# Patient Record
Sex: Male | Born: 1950 | Race: White | Hispanic: No | Smoking: Current every day smoker
Health system: Southern US, Community
[De-identification: ages and names within clinical notes are randomized; demographics above are authoritative.]

## PROBLEM LIST (undated history)

## (undated) DIAGNOSIS — Z87891 Personal history of nicotine dependence: Secondary | ICD-10-CM

## (undated) HISTORY — DX: Personal history of nicotine dependence: Z87.891

---

## 2006-01-04 ENCOUNTER — Ambulatory Visit: Payer: Self-pay | Admitting: Internal Medicine

## 2006-06-03 ENCOUNTER — Ambulatory Visit: Payer: Self-pay | Admitting: Surgery

## 2007-02-10 ENCOUNTER — Ambulatory Visit: Payer: Self-pay | Admitting: Internal Medicine

## 2007-02-14 ENCOUNTER — Encounter: Payer: Self-pay | Admitting: Internal Medicine

## 2007-02-26 ENCOUNTER — Encounter: Payer: Self-pay | Admitting: Internal Medicine

## 2009-02-28 ENCOUNTER — Ambulatory Visit: Payer: Self-pay | Admitting: Urology

## 2009-02-28 ENCOUNTER — Emergency Department: Payer: Self-pay | Admitting: Emergency Medicine

## 2010-03-17 ENCOUNTER — Encounter: Payer: Self-pay | Admitting: Cardiology

## 2010-04-15 ENCOUNTER — Ambulatory Visit: Payer: Self-pay | Admitting: Cardiology

## 2010-04-15 DIAGNOSIS — F17200 Nicotine dependence, unspecified, uncomplicated: Secondary | ICD-10-CM | POA: Insufficient documentation

## 2010-04-15 DIAGNOSIS — E785 Hyperlipidemia, unspecified: Secondary | ICD-10-CM

## 2010-04-15 DIAGNOSIS — R079 Chest pain, unspecified: Secondary | ICD-10-CM | POA: Insufficient documentation

## 2010-04-22 ENCOUNTER — Ambulatory Visit: Payer: Self-pay | Admitting: Cardiology

## 2010-04-22 ENCOUNTER — Ambulatory Visit (HOSPITAL_COMMUNITY): Admission: RE | Admit: 2010-04-22 | Discharge: 2010-04-22 | Payer: Self-pay | Admitting: Cardiology

## 2010-04-23 ENCOUNTER — Telehealth: Payer: Self-pay | Admitting: Cardiology

## 2010-05-05 ENCOUNTER — Telehealth: Payer: Self-pay | Admitting: Cardiology

## 2010-05-07 ENCOUNTER — Telehealth (INDEPENDENT_AMBULATORY_CARE_PROVIDER_SITE_OTHER): Payer: Self-pay | Admitting: *Deleted

## 2010-05-07 ENCOUNTER — Encounter: Payer: Self-pay | Admitting: Cardiology

## 2010-05-11 ENCOUNTER — Ambulatory Visit: Payer: Self-pay | Admitting: Cardiology

## 2010-05-11 ENCOUNTER — Ambulatory Visit: Payer: Self-pay

## 2010-05-11 ENCOUNTER — Encounter (HOSPITAL_COMMUNITY): Admission: RE | Admit: 2010-05-11 | Discharge: 2010-06-18 | Payer: Self-pay | Admitting: Cardiology

## 2010-05-21 ENCOUNTER — Telehealth: Payer: Self-pay | Admitting: Cardiology

## 2010-08-07 ENCOUNTER — Ambulatory Visit: Payer: Self-pay | Admitting: Unknown Physician Specialty

## 2010-10-27 NOTE — Assessment & Plan Note (Signed)
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 Hochrein Resting Radionuclide:  Technetium 77m Tetrofosmin     Resting Radionuclide Dose:  11.0 mCi  Stress Radionuclide:  Technetium 71m Tetrofosmin     Stress Radionuclide Dose:  33.0 mCi   Stress Protocol Exercise Time (min):  8:31 min     Max HR:  144 bpm     Predicted Max HR:  160 bpm  Max Systolic BP: 174 mm Hg     Percent Max HR:  90 %     METS: 10.1 Rate Pressure Product:  16109    Stress Test Technologist:  Irean Hong RN     Nuclear Technologist:  Domenic Polite CNMT  Rest Procedure  Myocardial perfusion imaging was performed at rest 45 minutes following the intravenous administration of Myoview Technetium 23m Tetrofosmin.  Stress Procedure  The patient exercised for 8 minutes and 31 seconds, RPE=15.  The patient stopped due to chest tightness 5/10 associated with DOE.  There were no significant ST-T wave changes, frequent PAC's, occ. PJC's, and 3 beat PSVT.  Myoview was injected at peak exercise and myocardial perfusion imaging was performed after a brief delay.  QPS Raw Data Images:  Patient motion noted; appropriate software correction  applied. Stress Images:  Mild decrease in activity in the inferior wall. Rest Images:  Same as stress Subtraction (SDS):  No reversibility Transient Ischemic Dilatation:  .99  (Normal <1.22)  Lung/Heart Ratio:  .31  (Normal <0.45)  Quantitative Gated Spect Images QGS EDV:  140 ml QGS ESV:  71 ml QGS EF:  49 % QGS cine images:  Mild hypokinesis  Findings Abnormal      Overall Impression  Exercise Capacity: Fair exercise capacity. BP Response: Normal blood pressure response. Clinical Symptoms: Moderate chest tightness ECG Impression: No significant ST segment change suggestive of ischemia. Overall Impression Comments: There is mild decrease in activity in the inferior wall. This may be mild scar with very mild ischemia.  Appended Document: Cardiology Nuclear Testing Discussed with the patient.  No evidence of ischemia in the LAD distribution.  He needs aggressive risk reduction to include no smoking.  He knows to call me with any symptoms in the future.

## 2010-10-27 NOTE — Assessment & Plan Note (Signed)
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 Alonna Buckler  CC:  chest pain.  History of Present Illness: The patient presents for evaluation of chest pain. He has no prior cardiac history but has significant cardiovascular risk factors. He reports that for 2-1/2 months he's been getting chest discomfort. This has been happening sporadically. It is at rest. It is a broad discomfort over his anterior chest without radiation to his neck or to his arms. It is not associated with nausea, vomiting or diaphoresis. It goes away spontaneously after minutes. It is somewhat mild but still very noticeable. He does not exercise routinely. With his activities of daily living he doesn't bring this on. He has noticed however with his activities but he is more fatigued and has a definite decreased exercise tolerance. He has not noticed any palpitations, presyncope or syncope. He is not describing any PND or orthopnea. He did have an exercise echocardiogram. His heart rate rose quickly with exercise reaching target in stage II. I do not have access to the images. However, after reviewing results he was referred here by his primary physician 2 rule out obstructive coronary disease.  Current Medications (verified): 1)  Cialis 5 Mg Tabs (Tadalafil) .... As Needed 2)  Aspirin 81 Mg  Tabs (Aspirin) .Marland Kitchen.. 1 By Mouth Daily 3)  Mirapex 1.5 Mg Tabs (Pramipexole Dihydrochloride) .Marland Kitchen.. 1 By Mouth Dialy 4)  Uroxatral 10 Mg Xr24h-Tab (Alfuzosin Hcl) .Marland Kitchen.. 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 Aseptic meningitis in October 1995 BPH Colonic polyps by 12/04. Right pulmonary nodule bt CT 2009. 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 [facility] 2007  Family History: Both parents are deceased.His father  headcornory disease, status post MI and cornonary artery bypass grafting early onset:  He  also had prostste cancer,status post irradiation treatment. Mother had a history  of longstanding hypertension and chronic atrial fibrillation and congestive heart failure: also diabetes.  Brother with myocardial infarction late 1s  Social History: He does smoke a pack a day and has done so for many years. Drinks alcohol. Had a Hepatitis B Vaccine series.PPD was negative in 1999. He has been divorced twice.He has four children.    Review of Systems       As stated in the HPI and negative for all other systems.   Vital Signs:  Patient profile:   60 year old male Height:      75 inches Weight:      172 pounds BMI:     21.58 Pulse rate:   64 / minute Resp:     16 per minute BP sitting:   110 / 74  (left arm)  Vitals Entered By: Marrion Coy, CNA (April 15, 2010 4:06 PM)  Physical Exam  General:  Well developed, well nourished, in no acute distress. Head:  normocephalic and atraumatic Eyes:  PERRLA/EOM intact; conjunctiva and lids normal. Mouth:  Teeth, gums and palate normal. Oral mucosa normal. Neck:  Neck supple, no JVD. No masses, thyromegaly  or abnormal cervical nodes. Chest Wall:  no deformities or breast masses noted Lungs:  Clear bilaterally to auscultation and percussion. Abdomen:  Bowel sounds positive; abdomen soft and non-tender without masses, organomegaly, or hernias noted. No hepatosplenomegaly. Msk:  Back normal, normal gait. Muscle strength and tone normal. Extremities:  No clubbing or cyanosis. Neurologic:  Alert and oriented x 3. Skin:  Intact  without lesions or rashes. Cervical Nodes:  no significant adenopathy Inguinal Nodes:  no significant adenopathy Psych:  Normal affect.   Detailed Cardiovascular Exam  Neck    Carotids: Carotids full and equal bilaterally without bruits.      Neck Veins: Normal, no JVD.    Heart    Inspection: no deformities or lifts noted.      Palpation: normal PMI with no thrills palpable.      Auscultation: regular rate and rhythm, S1, S2 without murmurs, rubs, gallops, or clicks.    Vascular    Abdominal Aorta: no palpable masses, pulsations, or audible bruits.      Femoral Pulses: normal femoral pulses bilaterally.      Pedal Pulses: normal pedal pulses bilaterally.      Radial Pulses: normal radial pulses bilaterally.      Peripheral Circulation: no clubbing, cyanosis, or edema noted with normal capillary refill.     EKG  Procedure date:  04/15/2010  Findings:      Sinus rhythm, rate 64, axis within normal limits, intervals within normal limits, no acute ST-T wave changes.  Impression & Recommendations:  Problem # 1:  CHEST PAIN (ICD-786.50) The patient has chest pain that is consistent with unstable angina. A stress test was equivocal. He has very remarkable risk factors and thus has a strong pretest probability of obstructive coronary disease. Coronary CT angiography would be an excellent test in this situation offering a noninvasive approach to rule out obstructive coronary disease. Orders: EKG w/ Interpretation (93000) Cardiac CTA (Cardiac CTA)  Problem # 2:  TOBACCO ABUSE (ICD-305.1) We had a long discussion about the need to stop smoking. He is not at a point in his life we can try to Chantix but he will consider this in the future.  Problem # 3:  DYSLIPIDEMIA (ICD-272.4) The patient's lipids were reviewed. His LDL was 100. HDL was 35. I would suggest an ideal lipid level for this gentleman would be an LDL less than 70 and HDL greater then 50. He needs exercise. I have  suggested Lipitor 10 mg daily.  Patient Instructions: 1)  Your physician recommends that you schedule a follow-up appointment as needed 2)  Your physician recommends that you continue on your current medications as directed. Please refer to the Current Medication list given to you today. 3)  You have been referred for a Cardiac CT Angiogram.

## 2010-10-27 NOTE — Progress Notes (Signed)
Summary: pt wants to talk to [facility] 05/04/2010  Phone Note Call from Patient Call back at [facility] Phone [phone]   Caller: Patient Summary of Call: test results Initial call taken by: Judie Grieve,  April 23, 2010 3:43 PM  Follow-up for Phone Call        Tired to call Follow-up by: Rollene Rotunda, MD, [facility],  April 24, 2010 2:23 PM  Additional Follow-up for Phone Call Additional follow up Details #1::        spoke with pt about results.  Dr Shirlee Latch  spoke with pt about the results and per pt told him he had moderate disease.  Pt would like to speak with Dr Antoine Poche but is out of town.  He would like to be call 05/04/2010.  Will forward message to Dr Antoine Poche Additional Follow-up by: Charolotte Capuchin, RN,  April 24, 2010 5:54 PM     Appended Document: pt wants to talk to [facility] 05/04/2010 Discussed with the patient.  I will arrange a stress perfusion study when he returns from vacation.

## 2010-10-27 NOTE — Progress Notes (Signed)
Summary: results of test***unable to leave message***  Phone Note Call from Patient   Reason for Call: Talk to Nurse, Lab or Test Results Summary of Call: pt wants results of test last week -pls call [phone] Initial call taken by: Glynda Jaeger,  May 21, 2010 1:25 PM  Follow-up for Phone Call        attempted to contact pt at [phone]--unable to leave a message Katina Dung, RN, BSN  May 21, 2010 3:04 PM   Information sent to [facility]. Asked her to review the results with Dr. Antoine Poche and for him to contact the patient regarding the results of his stress myoview. Whitney Maeola Sarah RN  May 21, 2010 5:04 PM   Additional Follow-up for Phone Call Additional follow up Details #1::        pt calling back-says he always answers the phone and if doesn't call back [phone] Glynda Jaeger  May 22, 2010 11:56 AM    Additional Follow-up for Phone Call Additional follow up Details #2::    Results called to the patient. Follow-up by: Rollene Rotunda, MD, [facility],  May 22, 2010 5:42 PM

## 2010-10-27 NOTE — Progress Notes (Signed)
Summary: Nuc Pre-Procedure  Phone Note Outgoing Call Call back at [facility] Phone [phone]   Call placed by: Antionette Char RN,  May 07, 2010 11:07 AM Call placed to: Patient Reason for Call: Confirm/change Appt Summary of Call: Reviewed information on Myoview Information Sheet (see scanned document for further details).  Spoke with patient.

## 2010-10-27 NOTE — Progress Notes (Signed)
Summary: schedule exercise/stress myoview  ---- Converted from flag ---- ---- 05/04/2010 12:55 PM, Rollene Rotunda, MD, [facility] wrote: This patient needs to have a stress prefusion study to evaluate the hemodynamic significance of the LAD stenosis seen on CT.  He can walk it. ------------------------------  Phone Note Outgoing Call   Summary of Call: Dr Antoine Poche has already spoken with pt and would like to get an exercise stress myoview scheduled. Order placed. [facility] will contact pt with appt. No beta blockers listed on medication list.  Julieta Gutting, RN, BSN  May 05, 2010 1:20 PM

## 2011-10-19 ENCOUNTER — Ambulatory Visit: Payer: Self-pay | Admitting: Unknown Physician Specialty

## 2011-10-20 LAB — PATHOLOGY REPORT

## 2012-07-30 ENCOUNTER — Other Ambulatory Visit: Payer: Self-pay | Admitting: Family Medicine

## 2012-07-30 LAB — PROTIME-INR
INR: 0.9
Prothrombin Time: 12.2 secs (ref 11.5–14.7)

## 2015-08-29 ENCOUNTER — Other Ambulatory Visit: Payer: Self-pay | Admitting: Family Medicine

## 2015-08-29 ENCOUNTER — Encounter: Payer: Self-pay | Admitting: Family Medicine

## 2015-08-29 ENCOUNTER — Inpatient Hospital Stay: Payer: Medicare Other | Attending: Family Medicine | Admitting: Family Medicine

## 2015-08-29 ENCOUNTER — Inpatient Hospital Stay: Admission: RE | Admit: 2015-08-29 | Payer: Self-pay | Source: Ambulatory Visit

## 2015-08-29 DIAGNOSIS — Z87891 Personal history of nicotine dependence: Secondary | ICD-10-CM

## 2015-08-29 HISTORY — DX: Personal history of nicotine dependence: Z87.891

## 2015-09-05 ENCOUNTER — Encounter: Payer: Self-pay | Admitting: Family Medicine

## 2015-09-05 ENCOUNTER — Inpatient Hospital Stay (HOSPITAL_BASED_OUTPATIENT_CLINIC_OR_DEPARTMENT_OTHER): Payer: Medicare Other | Admitting: Family Medicine

## 2015-09-05 ENCOUNTER — Ambulatory Visit
Admission: RE | Admit: 2015-09-05 | Discharge: 2015-09-05 | Disposition: A | Discharge status: Home / Self Care | Payer: Medicare Other | Source: Ambulatory Visit | Attending: Family Medicine | Admitting: Family Medicine

## 2015-09-05 DIAGNOSIS — J439 Emphysema, unspecified: Secondary | ICD-10-CM | POA: Insufficient documentation

## 2015-09-05 DIAGNOSIS — Z122 Encounter for screening for malignant neoplasm of respiratory organs: Secondary | ICD-10-CM

## 2015-09-05 DIAGNOSIS — Z87891 Personal history of nicotine dependence: Secondary | ICD-10-CM | POA: Diagnosis not present

## 2015-09-05 DIAGNOSIS — I700 Atherosclerosis of aorta: Secondary | ICD-10-CM | POA: Insufficient documentation

## 2015-09-05 NOTE — Progress Notes (Signed)
In accordance with CMS guidelines, patient has meet eligibility criteria including age, absence of signs or symptoms of lung cancer, the specific calculation of cigarette smoking pack-years was 40 years and is a current smoker.   A shared decision-making session was conducted prior to the performance of CT scan. This includes one or more decision aids, includes benefits and harms of screening, follow-up diagnostic testing, over-diagnosis, false positive rate, and total radiation exposure.  Counseling on the importance of adherence to annual lung cancer LDCT screening, impact of co-morbidities, and ability or willingness to undergo diagnosis and treatment is imperative for compliance of the program.  Counseling on the importance of continued smoking cessation for former smokers; the importance of smoking cessation for current smokers and information about tobacco cessation interventions have been given to patient including the [place] at [facility], 1800 quit [place], as well as Cancer Center specific smoking cessation programs.  Written order for lung cancer screening with LDCT has been given to the patient and any and all questions have been answered to the best of my abilities.   Yearly follow up will be scheduled by Shawn Perkins, Thoracic Navigator.   

## 2015-09-10 ENCOUNTER — Telehealth: Payer: Self-pay | Admitting: *Deleted

## 2015-09-10 NOTE — Telephone Encounter (Signed)
Attempted to notify patient of results of LDCT lung cancer screening. Unable to reach patient by phone. Results mailed to patient.

## 2015-10-29 DEATH — deceased

## 2017-10-21 IMAGING — CT CT CHEST LUNG CANCER SCREENING LOW DOSE W/O CM
2 of 4 series · 15 of 40 positions shown, 18 images · non-contrast
Comparison: None.

CLINICAL DATA: Current smoker. Asymptomatic. Forty pack-year
history.

EXAM:
CT CHEST WITHOUT CONTRAST
TECHNIQUE: Multidetector CT imaging of the chest was performed following the
standard protocol without IV contrast.

[Series 2: routine chest wo · axial · 0.73mm/px · z∈[-608,-308]mm · 12 of 72 slices shown, 15 images]
[im 6/72  mediastinal]
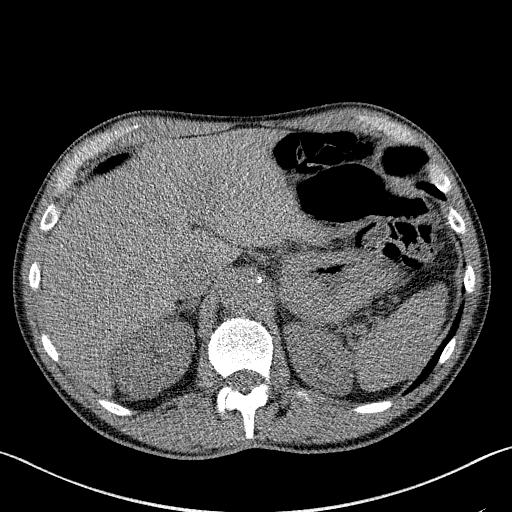
[im 6/72  lung]
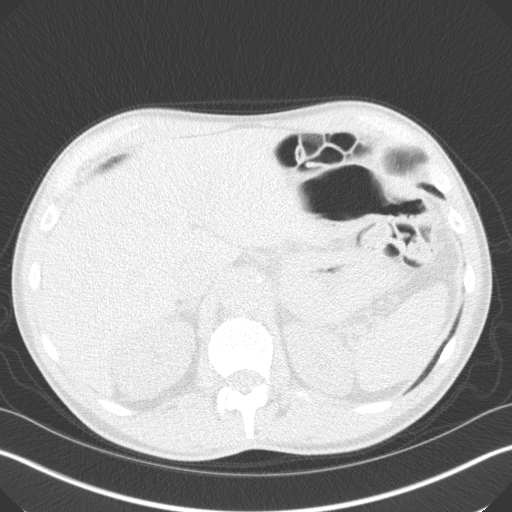
[im 11/72  lung]
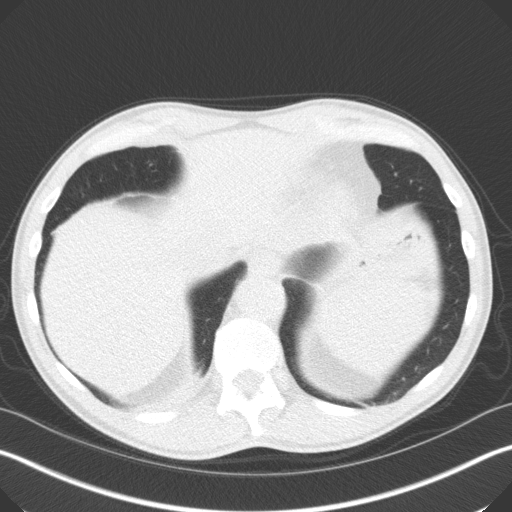
[im 17/72  lung]
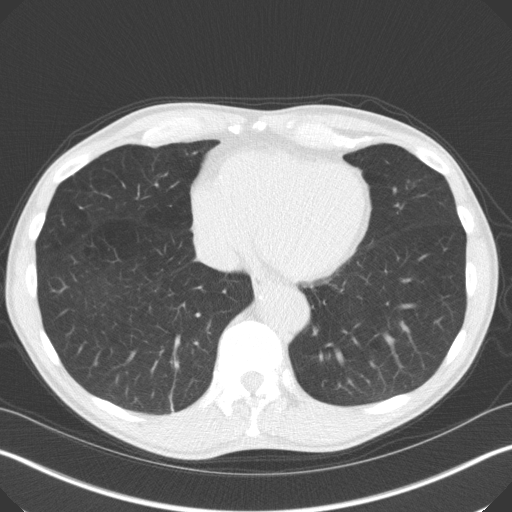
[im 22/72  lung]
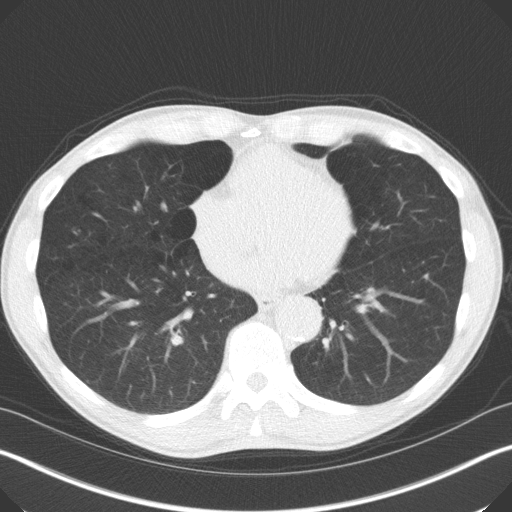
[im 28/72  mediastinal]
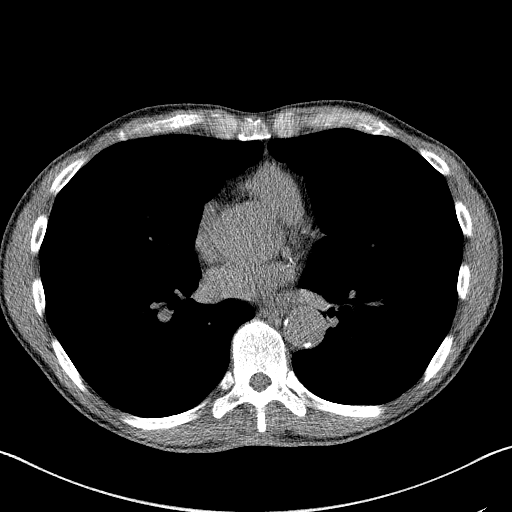
[im 28/72  lung]
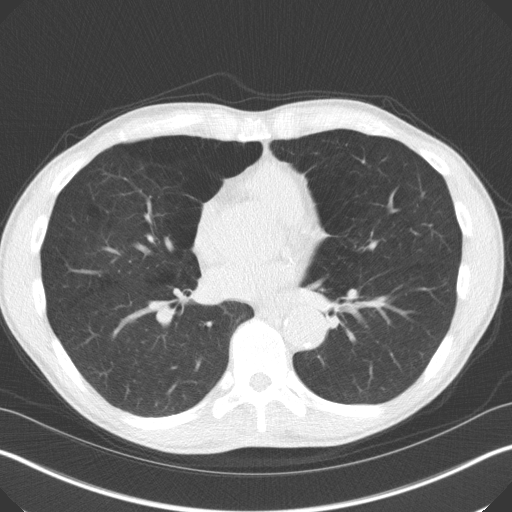
[im 33/72  lung]
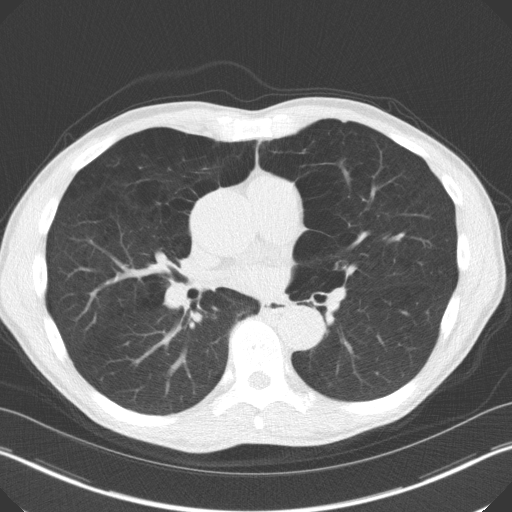
[im 39/72  lung]
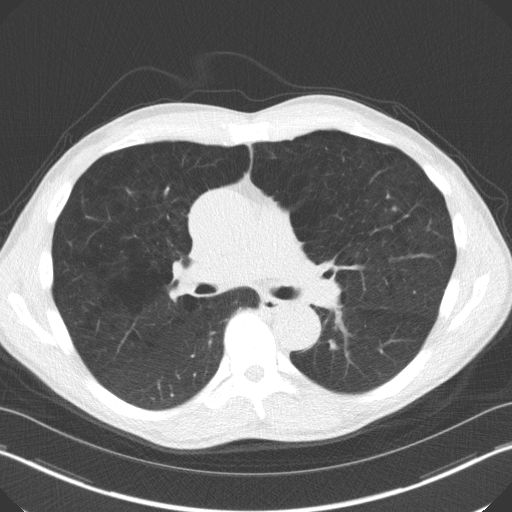
[im 44/72  lung]
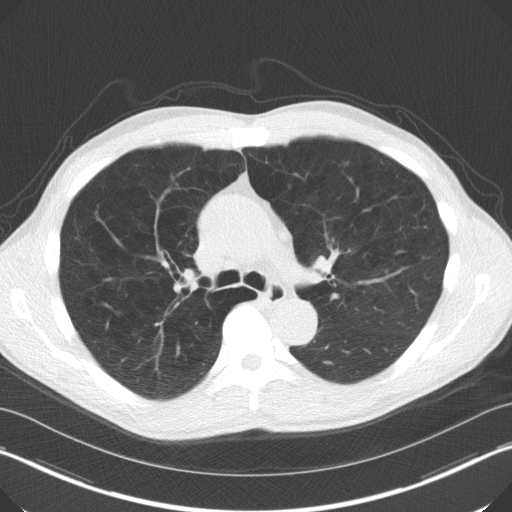
[im 50/72  mediastinal]
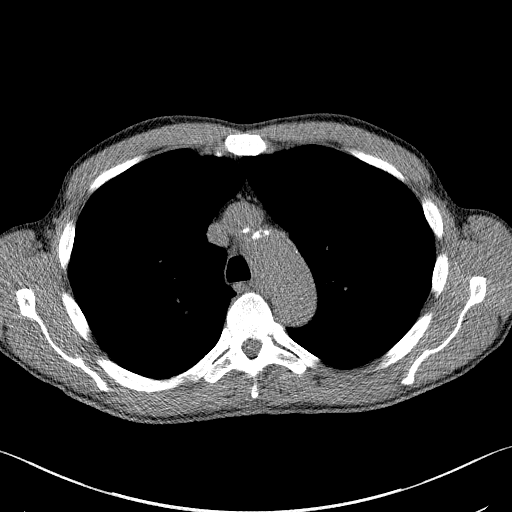
[im 50/72  lung]
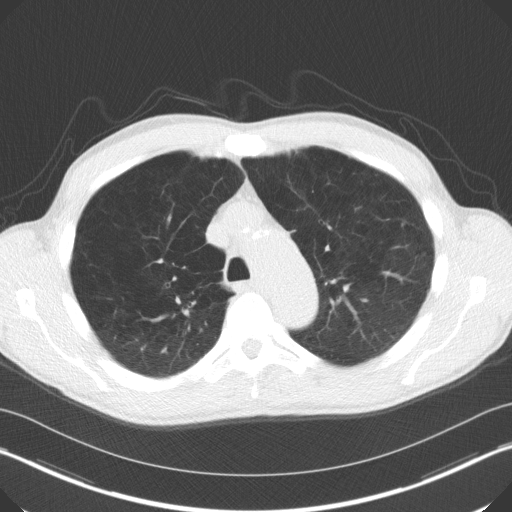
[im 55/72  lung]
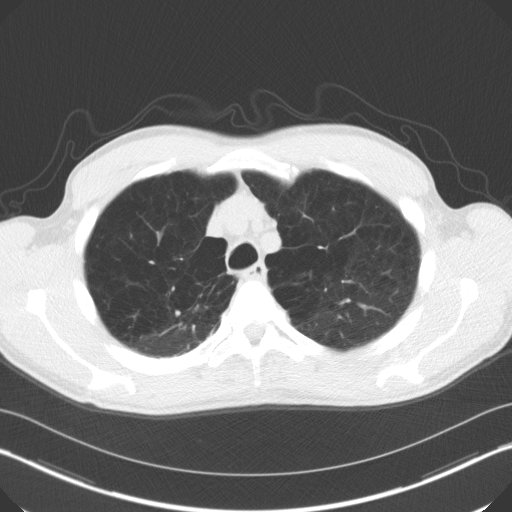
[im 61/72  lung]
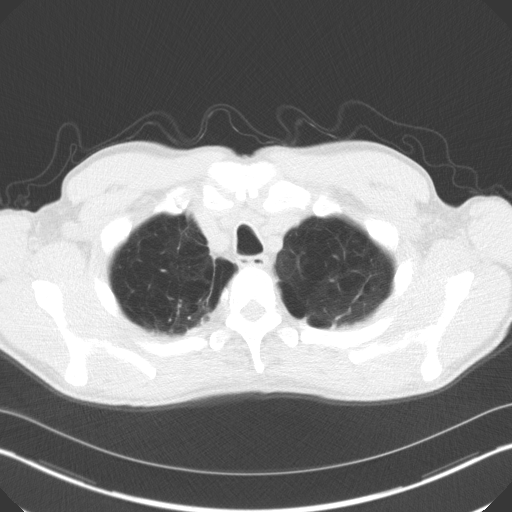
[im 66/72  lung]
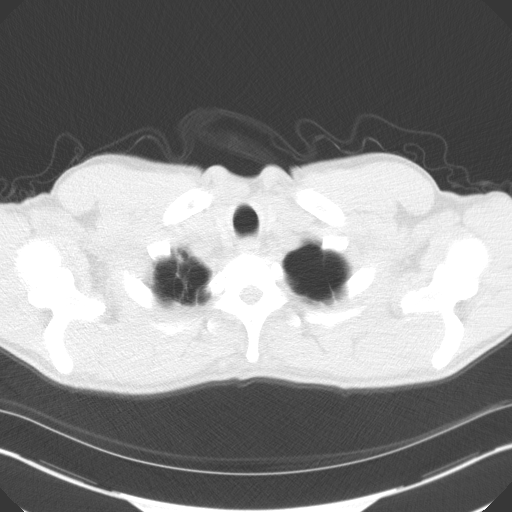

[Series 5: routine chest wo cor · coronal · 0.72mm/px · 3 of 259 slices shown]
[im 52/259  lung]
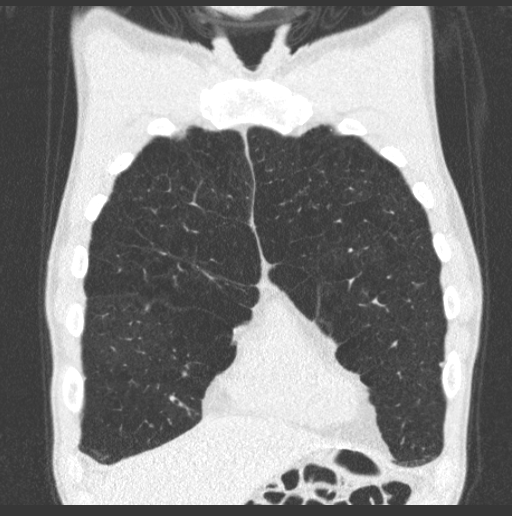
[im 104/259  lung]
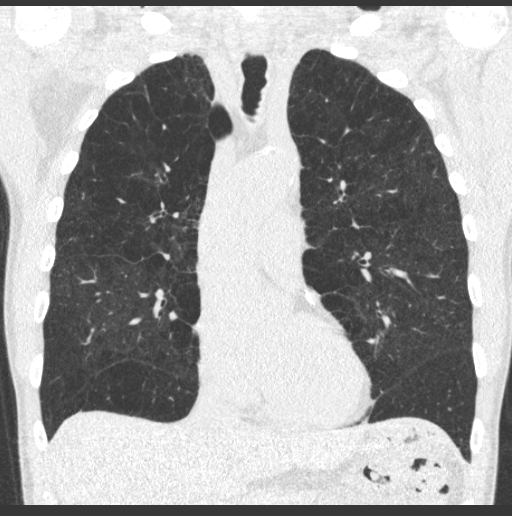
[im 155/259  lung]
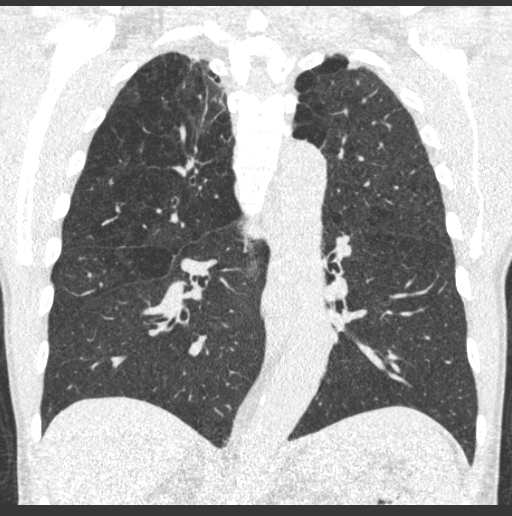

[15 of 40 positions shown; findings below may reference images not displayed]

FINDINGS: Mediastinum: The heart size appears normal. There is aortic
atherosclerosis noted. LAD coronary artery calcification noted. The
ascending thoracic aorta measures 4.7 cm in diameter. The esophagus
appears normal. Normal appearance of the trachea.

Lungs/Pleura: No pleural fluid identified. There are a few scattered
pulmonary nodules identified in both lungs. The largest nodule is in
the right middle lobe and has an equivalent diameter of 5.4 mm,
image 290 of series 3.

Upper Abdomen: Normal appearance of the liver and spleen. The
adrenal glands are normal. The visualized portions of the kidneys
are unremarkable.

Musculoskeletal: Spondylosis noted within the thoracic spine. No
aggressive lytic or sclerotic bone lesions. Moderate to marked
changes of centrilobular and paraseptal emphysema noted.
IMPRESSION: 1. Lung-RADS Category 2, benign appearance or behavior. Continue
annual screening with low-dose chest CT without contrast in 12
months
2. Aortic atherosclerosis
3. Emphysema.
